# Patient Record
Sex: Male | Born: 1960 | Race: White | Hispanic: No | Marital: Single
Health system: Southern US, Community
[De-identification: ages and names within clinical notes are randomized; demographics above are authoritative.]

## PROBLEM LIST (undated history)

## (undated) DIAGNOSIS — I509 Heart failure, unspecified: Secondary | ICD-10-CM

## (undated) DIAGNOSIS — I10 Essential (primary) hypertension: Secondary | ICD-10-CM

## (undated) HISTORY — PX: HERNIA REPAIR: SHX51

---

## 2017-08-07 ENCOUNTER — Emergency Department
Admission: EM | Admit: 2017-08-07 | Discharge: 2017-08-07 | Discharge status: Against Medical Advice | Payer: Medicare Other | Attending: Emergency Medicine | Admitting: Emergency Medicine

## 2017-08-07 ENCOUNTER — Emergency Department: Payer: Medicare Other

## 2017-08-07 ENCOUNTER — Encounter: Payer: Self-pay | Admitting: Emergency Medicine

## 2017-08-07 DIAGNOSIS — I509 Heart failure, unspecified: Secondary | ICD-10-CM | POA: Insufficient documentation

## 2017-08-07 DIAGNOSIS — R945 Abnormal results of liver function studies: Secondary | ICD-10-CM

## 2017-08-07 DIAGNOSIS — R7989 Other specified abnormal findings of blood chemistry: Secondary | ICD-10-CM | POA: Diagnosis not present

## 2017-08-07 DIAGNOSIS — I110 Hypertensive heart disease with heart failure: Secondary | ICD-10-CM | POA: Diagnosis not present

## 2017-08-07 DIAGNOSIS — Z950 Presence of cardiac pacemaker: Secondary | ICD-10-CM | POA: Insufficient documentation

## 2017-08-07 DIAGNOSIS — R1010 Upper abdominal pain, unspecified: Secondary | ICD-10-CM | POA: Insufficient documentation

## 2017-08-07 DIAGNOSIS — K8020 Calculus of gallbladder without cholecystitis without obstruction: Secondary | ICD-10-CM

## 2017-08-07 DIAGNOSIS — R109 Unspecified abdominal pain: Secondary | ICD-10-CM | POA: Diagnosis not present

## 2017-08-07 DIAGNOSIS — R1013 Epigastric pain: Secondary | ICD-10-CM

## 2017-08-07 HISTORY — DX: Heart failure, unspecified: I50.9

## 2017-08-07 HISTORY — DX: Essential (primary) hypertension: I10

## 2017-08-07 LAB — CBC WITH DIFFERENTIAL/PLATELET
BASOS ABS: 0.1 10*3/uL (ref 0–0.1)
BASOS PCT: 1 %
Eosinophils Absolute: 0.3 10*3/uL (ref 0–0.7)
Eosinophils Relative: 4 %
HCT: 53.8 % — ABNORMAL HIGH (ref 40.0–52.0)
Hemoglobin: 17.8 g/dL (ref 13.0–18.0)
LYMPHS ABS: 2.1 10*3/uL (ref 1.0–3.6)
LYMPHS PCT: 22 %
MCH: 30.4 pg (ref 26.0–34.0)
MCHC: 33.1 g/dL (ref 32.0–36.0)
MCV: 92 fL (ref 80.0–100.0)
Monocytes Absolute: 1.2 10*3/uL — ABNORMAL HIGH (ref 0.2–1.0)
Monocytes Relative: 13 %
NEUTROS ABS: 5.7 10*3/uL (ref 1.4–6.5)
NEUTROS PCT: 60 %
PLATELETS: 187 10*3/uL (ref 150–440)
RBC: 5.85 MIL/uL (ref 4.40–5.90)
RDW: 14.9 % — AB (ref 11.5–14.5)
WBC: 9.5 10*3/uL (ref 3.8–10.6)

## 2017-08-07 LAB — COMPREHENSIVE METABOLIC PANEL
ALT: 20 U/L (ref 17–63)
ANION GAP: 7 (ref 5–15)
AST: 24 U/L (ref 15–41)
Albumin: 4 g/dL (ref 3.5–5.0)
Alkaline Phosphatase: 94 U/L (ref 38–126)
BUN: 19 mg/dL (ref 6–20)
CO2: 27 mmol/L (ref 22–32)
CREATININE: 1.29 mg/dL — AB (ref 0.61–1.24)
Calcium: 8.8 mg/dL — ABNORMAL LOW (ref 8.9–10.3)
Chloride: 103 mmol/L (ref 101–111)
GFR, EST NON AFRICAN AMERICAN: 60 mL/min — AB (ref >60)
Glucose, Bld: 179 mg/dL — ABNORMAL HIGH (ref 65–99)
Potassium: 3.8 mmol/L (ref 3.5–5.1)
SODIUM: 137 mmol/L (ref 135–145)
TOTAL PROTEIN: 7.3 g/dL (ref 6.5–8.1)
Total Bilirubin: 2 mg/dL — ABNORMAL HIGH (ref 0.3–1.2)

## 2017-08-07 LAB — BILIRUBIN, FRACTIONATED(TOT/DIR/INDIR)
BILIRUBIN TOTAL: 1.8 mg/dL — AB (ref 0.3–1.2)
Bilirubin, Direct: 0.4 mg/dL (ref 0.1–0.5)
Indirect Bilirubin: 1.4 mg/dL — ABNORMAL HIGH (ref 0.3–0.9)

## 2017-08-07 LAB — URINALYSIS, COMPLETE (UACMP) WITH MICROSCOPIC
BILIRUBIN URINE: NEGATIVE
Bacteria, UA: NONE SEEN
GLUCOSE, UA: NEGATIVE mg/dL
HGB URINE DIPSTICK: NEGATIVE
Ketones, ur: NEGATIVE mg/dL
LEUKOCYTES UA: NEGATIVE
NITRITE: NEGATIVE
PH: 5 (ref 5.0–8.0)
Protein, ur: 30 mg/dL — AB
SPECIFIC GRAVITY, URINE: 1.038 — AB (ref 1.005–1.030)
Squamous Epithelial / LPF: NONE SEEN

## 2017-08-07 LAB — LIPASE, BLOOD: Lipase: 39 U/L (ref 11–51)

## 2017-08-07 LAB — PROTIME-INR
INR: 1.22
PROTHROMBIN TIME: 15.3 s — AB (ref 11.4–15.2)

## 2017-08-07 LAB — BRAIN NATRIURETIC PEPTIDE: B Natriuretic Peptide: 1139 pg/mL — ABNORMAL HIGH (ref 0.0–100.0)

## 2017-08-07 LAB — TROPONIN I: Troponin I: <0.03 ng/mL (ref <0.03)

## 2017-08-07 LAB — LACTIC ACID, PLASMA: Lactic Acid, Venous: 1.6 mmol/L (ref 0.5–1.9)

## 2017-08-07 MED ORDER — CIPROFLOXACIN HCL 500 MG PO TABS: 500.0000 mg | ORAL_TABLET | Freq: Two times a day (BID) | ORAL | 0 refills | Status: AC

## 2017-08-07 MED ORDER — IOPAMIDOL (ISOVUE-300) INJECTION 61%
30.0000 mL | Freq: Once | INTRAVENOUS | Status: DC | PRN
Start: 2017-08-07 — End: 2017-08-08

## 2017-08-07 MED ORDER — METRONIDAZOLE 500 MG PO TABS
500.0000 mg | ORAL_TABLET | Freq: Once | ORAL | Status: AC
  Administered 2017-08-07: 500 mg via ORAL
  Filled 2017-08-07: qty 1

## 2017-08-07 MED ORDER — MORPHINE SULFATE (PF) 4 MG/ML IV SOLN
4.0000 mg | Freq: Once | INTRAVENOUS | Status: AC
  Administered 2017-08-07: 4 mg via INTRAVENOUS

## 2017-08-07 MED ORDER — ONDANSETRON HCL 4 MG/2ML IJ SOLN
INTRAMUSCULAR | Status: AC
  Administered 2017-08-07: 4 mg via INTRAVENOUS
  Filled 2017-08-07: qty 2

## 2017-08-07 MED ORDER — ONDANSETRON 4 MG PO TBDP: 4.0000 mg | ORAL_TABLET | Freq: Three times a day (TID) | ORAL | 0 refills | Status: AC | PRN

## 2017-08-07 MED ORDER — MORPHINE SULFATE (PF) 4 MG/ML IV SOLN
INTRAVENOUS | Status: AC
  Administered 2017-08-07: 4 mg via INTRAVENOUS
  Filled 2017-08-07: qty 1

## 2017-08-07 MED ORDER — IOPAMIDOL (ISOVUE-300) INJECTION 61%
100.0000 mL | Freq: Once | INTRAVENOUS | Status: AC | PRN
  Administered 2017-08-07: 100 mL via INTRAVENOUS

## 2017-08-07 MED ORDER — CIPROFLOXACIN HCL 500 MG PO TABS
500.0000 mg | ORAL_TABLET | Freq: Once | ORAL | Status: AC
  Administered 2017-08-07: 500 mg via ORAL
  Filled 2017-08-07: qty 1

## 2017-08-07 MED ORDER — MORPHINE SULFATE (PF) 4 MG/ML IV SOLN
4.0000 mg | Freq: Once | INTRAVENOUS | Status: AC
  Administered 2017-08-07: 4 mg via INTRAVENOUS
  Filled 2017-08-07: qty 1

## 2017-08-07 MED ORDER — OXYCODONE HCL 5 MG PO TABS: 5.0000 mg | ORAL_TABLET | Freq: Four times a day (QID) | ORAL | 0 refills | Status: AC | PRN

## 2017-08-07 MED ORDER — METRONIDAZOLE 500 MG PO TABS: 500.0000 mg | ORAL_TABLET | Freq: Three times a day (TID) | ORAL | 0 refills | Status: AC

## 2017-08-07 MED ORDER — ONDANSETRON HCL 4 MG/2ML IJ SOLN
4.0000 mg | Freq: Once | INTRAMUSCULAR | Status: AC
  Administered 2017-08-07: 4 mg via INTRAVENOUS

## 2017-08-07 NOTE — ED Provider Notes (Signed)
[facility] Emergency Department Provider Note   ____________________________________________   First MD Initiated Contact with Patient 08/07/17 1742     (approximate)  I have reviewed the triage vital signs and the nursing notes.   HISTORY  Chief Complaint Abdominal Pain and Emesis    HPI Jesse Huffman is a 56 y.o. male Who reports onset of severe burning approximately 1-1/2 hours ago. He has nausea and vomiting with it. He has no diarrhea. He has no cramps and no fever. He says he had it start after eating grocery chicken.he has vomited up all the chicken. The pain is 9 out of 10.patient has no chest pain or tightness no shortness of breath no sweating   Past Medical History:  Diagnosis Date  . CHF (congestive heart failure) (HCC)   . Hypertension     There are no active problems to display for this patient.   Past Surgical History:  Procedure Laterality Date  . HERNIA REPAIR    umbilical hernia repair  Prior to Admission medications   Not on File    Allergies Penicillins and Sulfa antibiotics  No family history on file.  Social History Social History  Substance Use Topics  . Smoking status: Not on file  . Smokeless tobacco: Not on file  . Alcohol use Yes    Review of Systems  Constitutional: No fever/chills Eyes: No visual changes. ENT: No sore throat. Cardiovascular: Denies chest pain. Respiratory: Denies shortness of breath. Gastrointestinal: see history of present illness Genitourinary: Negative for dysuria. Musculoskeletal: Negative for back pain. Skin: Negative for rash. Neurological: Negative for headaches, focal weakness   ____________________________________________   PHYSICAL EXAM:  VITAL SIGNS: ED Triage Vitals [08/07/17 1744]  Enc Vitals Group     BP (!) 117/92     Pulse Rate 68     Resp 20     Temp      Temp src      SpO2 97 %     Weight      Height      Head Circumference      Peak  Flow      Pain Score 9     Pain Loc      Pain Edu?      Excl. in GC?     Constitutional: Alert and oriented. Ill appearing and in  acute distress. Eyes: Conjunctivae are normal.  Head: Atraumatic. Nose: No congestion/rhinnorhea. Mouth/Throat: Mucous membranes are moist.  Oropharynx non-erythematous. Neck: No stridor.  Cardiovascular: Normal rate, regular rhythm. Grossly normal heart sounds.  Good peripheral circulation. Respiratory: Normal respiratory effort.  No retractions. Lungs CTAB. Gastrointestinal: Soft tender to palpation and percussion especially in the mid abdomen  distention. No abdominal bruits.decreased bowel sounds No CVA tenderness. Musculoskeletal: No lower extremity tenderness nor edema.  No joint effusions. Neurologic:  Normal speech and language. No gross focal neurologic deficits are appreciated.  Skin:  Skin is warm, dry and intact. No rash noted. Psychiatric: Mood and affect are normal. Speech and behavior are normal.  ____________________________________________   LABS (all labs ordered are listed, but only abnormal results are displayed)  Labs Reviewed  COMPREHENSIVE METABOLIC PANEL - Abnormal; Notable for the following:       Result Value   Glucose, Bld 179 (*)    Creatinine, Ser 1.29 (*)    Calcium 8.8 (*)    Total Bilirubin 2.0 (*)    GFR calc non Af Amer 60 (*)    All other  components within normal limits  URINALYSIS, COMPLETE (UACMP) WITH MICROSCOPIC - Abnormal; Notable for the following:    Color, Urine YELLOW (*)    APPearance CLEAR (*)    Specific Gravity, Urine 1.038 (*)    Protein, ur 30 (*)    All other components within normal limits  BRAIN NATRIURETIC PEPTIDE - Abnormal; Notable for the following:    B Natriuretic Peptide 1,139.0 (*)    All other components within normal limits  CBC WITH DIFFERENTIAL/PLATELET - Abnormal; Notable for the following:    HCT 53.8 (*)    RDW 14.9 (*)    Monocytes Absolute 1.2 (*)    All other components  within normal limits  LIPASE, BLOOD  TROPONIN I  LACTIC ACID, PLASMA  TROPONIN I   ____________________________________________  EKG  EKG read and interpreted by me shows normal sinus rhythm att67 left axisflipped T's laterally in the arm and chest leads no old EKGs ____________________________________________  RADIOLOGY  IMPRESSION: Cardiomegaly without congestive failure or acute pulmonary process. Pacemaker in place.   Electronically Signed   By: Rubye OaksMelanie  Ehinger M.D.   On: 08/07/2017 18:12 _IMPRESSION: Question stone in the gallbladder neck. Consider further evaluation with right upper quadrant ultrasound.  Diffuse stranding/ inflammation surrounding the bladder suggesting cystitis.  Marked fatty infiltration of liver.  Small amount of ascites in the bilateral pericolic gutter and in the pelvis.   Electronically Signed   By: Sherian ReinWei-Chen  Lin M.D.   On: 08/07/2017 20:20___________________________________________   PROCEDURES  Procedure(s) performed:   Procedures  Critical Care performed:   ____________________________________________   INITIAL IMPRESSION / ASSESSMENT AND PLAN / ED COURSE  Pertinent labs & imaging results that were available during my care of the patient were reviewed by me and considered in my medical decision making (see chart for details).  ----------------------------------------- 8:35 PM on 08/07/2017 -----------------------------------------  Patient has no suprapubic pain or tenderness no dysuria Patient's mid abdominal pain is rmuch better than it was. Patient still has some right upper  Quadrant pain I have ordered an ultrasound since the CAT  Scan thought there may be a stone in the all bladder neck I have signed out the patient to Dr. Scotty CourtStafford  ____________________________________________   FINAL CLINICAL IMPRESSION(S) / ED DIAGNOSES  Final diagnoses:  Abdominal pain, unspecified abdominal location      NEW  MEDICATIONS STARTED DURING THIS VISIT:  New Prescriptions   No medications on file     Note:  This document was prepared using Dragon voice recognition software and may include unintentional dictation errors.    Arnaldo NatalMalinda, Emmie Frakes F, MD 08/07/17 2051

## 2017-08-07 NOTE — Consult Note (Signed)
Patient ID: Jesse Huffman, male   DOB: [birth year], 56 y.o.   MRN: [id number]  HPI Jesse Carpenndrew M Sellin is a 56 y.o. male asked to see in consultation by Dr. Scotty CourtStafford. He reports acute onset of abdominal pain nausea and vomiting after he heavy meal this afternoon. Patient reports the pain is burning and sharp in nature he was 9 out of 10 and located in the epigastric area and right upper quadrant. He fell initially there was food poisoning. No previous episodes like this before. Despite medical history significant for CHF with an ejection fraction of 25% that was performed a few months ago. 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 He is status post AICD and has been managed medically. No previous heart surgery or stents. CT scan personally reviewed showing evidence of fatty liver and some small amount of ascites. Ultrasound personally reviewed as well showed a normal common bile duct. Questionable cirrhosis with ascites and also evidence of cholelithiasis. Some thickening of the gallbladder wall up to 6 mm. Laboratory data includes a total bilirubin of 1.8 with normal direct bilirubin and elevated indirect bilirubin. INR is 1.2, BNP is 1139, redness 1.29 and BUN is 19. Hemoglobin is 17.8  HPI  Past Medical History:  Diagnosis Date  . CHF (congestive heart failure) (HCC)   . Hypertension     Past Surgical History:  Procedure Laterality Date  . HERNIA REPAIR      No family history on file.  Social History Social History  Substance Use Topics  . Smoking status: Not on file  . Smokeless tobacco: Not on file  . Alcohol use Yes    Allergies  Allergen Reactions  . Penicillins Anaphylaxis  . Sulfa Antibiotics Anaphylaxis    Current Facility-Administered Medications  Medication Dose Route Frequency Provider Last Rate Last Dose  . iopamidol (ISOVUE-300) 61 % injection 30 mL  30 mL Oral Once PRN  Arnaldo NatalMalinda, Paul F, MD       No current outpatient prescriptions on file.     Review of Systems Full ROS  was asked and was negative except for the information on the HPI  Physical Exam Blood pressure (!) 142/105, pulse 69, resp. rate 17, SpO2 96 %. CONSTITUTIONAL: NAD EYES: Pupils are equal, round, and reactive to light, Sclera are non-icteric. EARS, NOSE, MOUTH AND THROAT: The oropharynx is clear. The oral mucosa is pink and moist. Hearing is intact to voice. LYMPH NODES:  Lymph nodes in the neck are normal. RESPIRATORY:  Lungs are clear. There is normal respiratory effort, with equal breath sounds bilaterally, and without pathologic use of accessory muscles. CARDIOVASCULAR: Heart is regular without murmurs, gallops, or rubs. GI: The abdomen is soft, mild TTP RUQ, no peritonitis , no Murphy.There are no palpable masses. There is no hepatosplenomegaly. There are normal bowel sounds in all quadrants. GU: Rectal deferred.   MUSCULOSKELETAL: Normal muscle strength and tone. No cyanosis . Left LE pitting edema up to mid calf SKIN: Turgor is good and there are no pathologic skin lesions or ulcers. NEUROLOGIC: Motor and sensation is grossly normal. Cranial nerves are grossly intact. PSYCH:  Oriented to person, place and time. Affect is normal.  Data Reviewed  I have personally reviewed the patient's imaging, laboratory findings and medical records.    Assessment/Plan 56 year old male with significant history of CHF with ejection fraction roughly of 25% now presented with abdominal pain nausea and vomiting. Difficult  to exclude cholecystitis. Imaging is status are so far inconclusive for evidence of cholecystitis and clinically there is no evidence of Murphy sign. He does have some images concerning for cirrhosis and ascites. Given all of these mixed findings also just admission to the hospital and performing a HIDA scan as well as obtain a cardiology evaluation with a repeat echocardiogram. No  need for emergent surgical revision at this time. No evidence of cholangitis with sepsis at this time. Discussed with the patient detail and I did share with them my thoughts. Patient is adamant that he wishes to go back to [facility] were he has he's longtime cardiologist available. I've also discussed situation with Dr. Scotty Court from the ER. I have also added a direct and indirect bilirubin as well as order for saline bolus for hydration. Extensive counseling provided. Total time for encounter was 80 minutes with greater than 50% spent in counseling and coordination of care    Sterling Big, MD FACS General Surgeon 08/07/2017, 10:51 PM

## 2017-08-07 NOTE — Discharge Instructions (Signed)
Please follow up with a primary care doctor or general surgeon in [place] as soon as possible for continued monitoring of your symptoms and possible surgery of your gallbladder. Return to the nearest ER if you have worsening of your symptoms including fever, vomiting, or severe pain.  Results for orders placed or performed during the hospital encounter of 08/07/17  Lipase, blood  Result Value Ref Range   Lipase 39 11 - 51 U/L  Comprehensive metabolic panel  Result Value Ref Range   Sodium 137 135 - 145 mmol/L   Potassium 3.8 3.5 - 5.1 mmol/L   Chloride 103 101 - 111 mmol/L   CO2 27 22 - 32 mmol/L   Glucose, Bld 179 (H) 65 - 99 mg/dL   BUN 19 6 - 20 mg/dL   Creatinine, Ser 1.611.29 (H) 0.61 - 1.24 mg/dL   Calcium 8.8 (L) 8.9 - 10.3 mg/dL   Total Protein 7.3 6.5 - 8.1 g/dL   Albumin 4.0 3.5 - 5.0 g/dL   AST 24 15 - 41 U/L   ALT 20 17 - 63 U/L   Alkaline Phosphatase 94 38 - 126 U/L   Total Bilirubin 2.0 (H) 0.3 - 1.2 mg/dL   GFR calc non Af Amer 60 (L) >60 mL/min   GFR calc Af Amer >60 >60 mL/min   Anion gap 7 5 - 15  Urinalysis, Complete w Microscopic  Result Value Ref Range   Color, Urine YELLOW (A) YELLOW   APPearance CLEAR (A) CLEAR   Specific Gravity, Urine 1.038 (H) 1.005 - 1.030   pH 5.0 5.0 - 8.0   Glucose, UA NEGATIVE NEGATIVE mg/dL   Hgb urine dipstick NEGATIVE NEGATIVE   Bilirubin Urine NEGATIVE NEGATIVE   Ketones, ur NEGATIVE NEGATIVE mg/dL   Protein, ur 30 (A) NEGATIVE mg/dL   Nitrite NEGATIVE NEGATIVE   Leukocytes, UA NEGATIVE NEGATIVE   RBC / HPF 0-5 0 - 5 RBC/hpf   WBC, UA 0-5 0 - 5 WBC/hpf   Bacteria, UA NONE SEEN NONE SEEN   Squamous Epithelial / LPF NONE SEEN NONE SEEN   Mucus PRESENT   Troponin I  Result Value Ref Range   Troponin I <0.03 <0.03 ng/mL  Lactic acid, plasma  Result Value Ref Range   Lactic Acid, Venous 1.6 0.5 - 1.9 mmol/L  Brain natriuretic peptide  Result Value Ref Range   B Natriuretic Peptide 1,139.0 (H) 0.0 - 100.0 pg/mL  CBC with  Differential  Result Value Ref Range   WBC 9.5 3.8 - 10.6 K/uL   RBC 5.85 4.40 - 5.90 MIL/uL   Hemoglobin 17.8 13.0 - 18.0 g/dL   HCT 09.653.8 (H) 04.540.0 - 40.952.0 %   MCV 92.0 80.0 - 100.0 fL   MCH 30.4 26.0 - 34.0 pg   MCHC 33.1 32.0 - 36.0 g/dL   RDW 81.114.9 (H) 91.411.5 - 78.214.5 %   Platelets 187 150 - 440 K/uL   Neutrophils Relative % 60 %   Neutro Abs 5.7 1.4 - 6.5 K/uL   Lymphocytes Relative 22 %   Lymphs Abs 2.1 1.0 - 3.6 K/uL   Monocytes Relative 13 %   Monocytes Absolute 1.2 (H) 0.2 - 1.0 K/uL   Eosinophils Relative 4 %   Eosinophils Absolute 0.3 0 - 0.7 K/uL   Basophils Relative 1 %   Basophils Absolute 0.1 0 - 0.1 K/uL  Troponin I  Result Value Ref Range   Troponin I <0.03 <0.03 ng/mL  Protime-INR  Result Value Ref Range  Prothrombin Time 15.3 (H) 11.4 - 15.2 seconds   INR 1.22   Bilirubin, fractionated(tot/dir/indir)  Result Value Ref Range   Total Bilirubin 1.8 (H) 0.3 - 1.2 mg/dL   Bilirubin, Direct 0.4 0.1 - 0.5 mg/dL   Indirect Bilirubin 1.4 (H) 0.3 - 0.9 mg/dL   Ct Abdomen Pelvis W Contrast  Result Date: 08/07/2017 CLINICAL DATA:  Epigastric pain today. EXAM: CT ABDOMEN AND PELVIS WITH CONTRAST TECHNIQUE: Multidetector CT imaging of the abdomen and pelvis was performed using the standard protocol following bolus administration of intravenous contrast. CONTRAST:  ISOVUE-300 IOPAMIDOL (ISOVUE-300) INJECTION 61% COMPARISON:  None. FINDINGS: Lower chest: The heart size is enlarged. There is a small pericardial effusion. Minimal dependent atelectasis of the posterior lung bases are noted. Hepatobiliary: There is diffuse low density of the liver without vessel displacement. No focal liver lesion is identified. There is question gallstone in the gallbladder neck. The biliary tree is normal. Pancreas: Unremarkable. No pancreatic ductal dilatation or surrounding inflammatory changes. Spleen: Normal in size without focal abnormality. Adrenals/Urinary Tract: Adrenal glands are  unremarkable. Bilateral renal scars are noted. Kidneys are without renal calculi, focal lesion, or hydronephrosis. There is stranding and inflammation surrounding the bladder. Stomach/Bowel: There is a small hiatal hernia is. The the stomach is otherwise normal. The transverse colon is decompressed limiting evaluation for bowel wall thickness. There is no small or large bowel obstruction. There is diverticulosis of the colon. The appendix is not seen but no inflammation is noted around cecum. Vascular/Lymphatic: Aortic atherosclerosis. No enlarged abdominal or pelvic lymph nodes. Reproductive: Prostate is unremarkable. Other: There is small amount of ascites in the bilateral pericolic gutter and in the pelvis. Musculoskeletal: Degenerative joint changes of the spine are noted. IMPRESSION: Question stone in the gallbladder neck. Consider further evaluation with right upper quadrant ultrasound. Diffuse stranding/ inflammation surrounding the bladder suggesting cystitis. Marked fatty infiltration of liver. Small amount of ascites in the bilateral pericolic gutter and in the pelvis. Electronically Signed   By: Sherian Rein M.D.   On: 08/07/2017 20:20   Dg Chest Portable 1 View  Result Date: 08/07/2017 CLINICAL DATA:  Epigastric abdominal pain. EXAM: PORTABLE CHEST 1 VIEW COMPARISON:  None. FINDINGS: Single lead left-sided pacemaker in place, lead projecting over the right ventricle. There is cardiomegaly. No pulmonary edema. No large pleural effusion or confluent airspace disease. No pneumothorax. IMPRESSION: Cardiomegaly without congestive failure or acute pulmonary process. Pacemaker in place. Electronically Signed   By: Rubye Oaks M.D.   On: 08/07/2017 18:12   US Abdomen Limited Ruq  Result Date: 08/07/2017 CLINICAL DATA:  Epigastric pain for 1 day EXAM: ULTRASOUND ABDOMEN LIMITED RIGHT UPPER QUADRANT COMPARISON:  CT 08/07/2017 FINDINGS: Gallbladder: Calculi measuring up to 7 mm. Intraluminal sludge.  Moderate gallbladder wall thickening, nearly 6 mm. No notable tenderness to probe pressure over the gallbladder, but the patient had been medicated. Common bile duct: Diameter: Normal, 2.9 mm. Liver: Diffuse mild nodularity of the liver. Small volume perihepatic ascites. Bidirectional flow within the portal vein. IMPRESSION: 1. Cholelithiasis with moderate gallbladder wall thickening. Cannot exclude acute cholecystitis. 2. Cirrhotic appearing liver with small volume perihepatic ascites. Bidirectional flow within the portal vein, suggesting portal hypertension. Electronically Signed   By: Ellery Plunk M.D.   On: 08/07/2017 21:48

## 2017-08-07 NOTE — ED Provider Notes (Signed)
 -----------------------------------------   11:27 PM on 08/07/2017 -----------------------------------------  On reassessment at 10:00 PM after obtaining the ultrasound result that showed cholelithiasis and all bladder wall thickening, reassess the patient, felt he still had right upper quadrant tenderness. Pain and nausea are much improved from before. Consulted surgery who felt the patient warranted hospitalization with the medical service for further workup including HIDA scan, serial exams and vital sign monitoring, and pre-op cardiology clearance with echocardiogram. Patient refuses, wants to get [place] [facility] [place] tonight, and states that he'll see a doctor there. He does not currently have a doctor there except for his cardiologist, but states that he'll follow-up right away. Return precautions discussed. He has medical decision-making capacity and will be discharged against medical advice  Final diagnoses:  Abdominal pain, unspecified abdominal location  Pain of upper abdomen  Calculus of gallbladder without cholecystitis without obstruction  Elevated LFTs      Sharman CheekStafford, Adynn Caseres, MD 08/07/17 2329

## 2017-08-07 NOTE — ED Triage Notes (Signed)
Pt reports abdominal pain and vomiting that started today after eating some food from the grocery store.

## 2017-08-07 NOTE — ED Notes (Signed)
Pt's O2 sats noted to be dropping to 84%; pt's pulse ox site changed, and patient repositioned; pt's O2 sats went up to 96%. Will continue to monitor.

## 2019-03-21 IMAGING — US US ABDOMEN LIMITED
1 series · 14 of 25 positions shown · non-contrast
Comparison: CT 08/07/2017

CLINICAL DATA: Epigastric pain for 1 day

EXAM:
ULTRASOUND ABDOMEN LIMITED RIGHT UPPER QUADRANT

[Series 1: us abdomen limited · 0.30mm/px · 54 acquisitions, 14 frames shown]
[im 1/54]
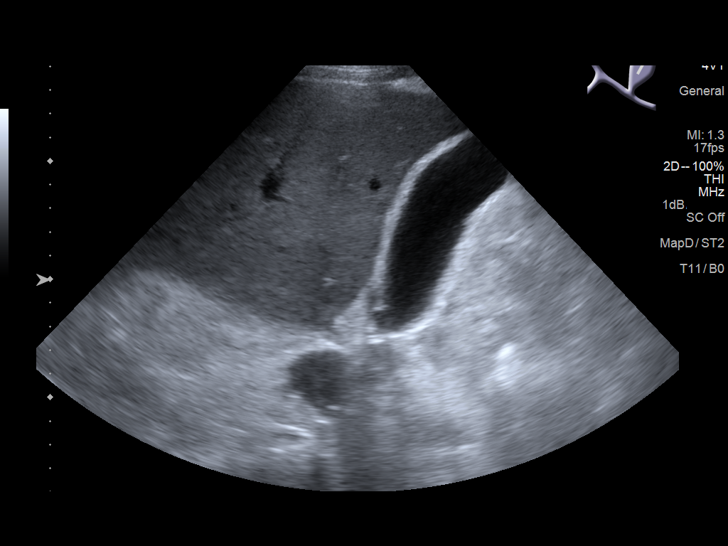
[im 5/54]
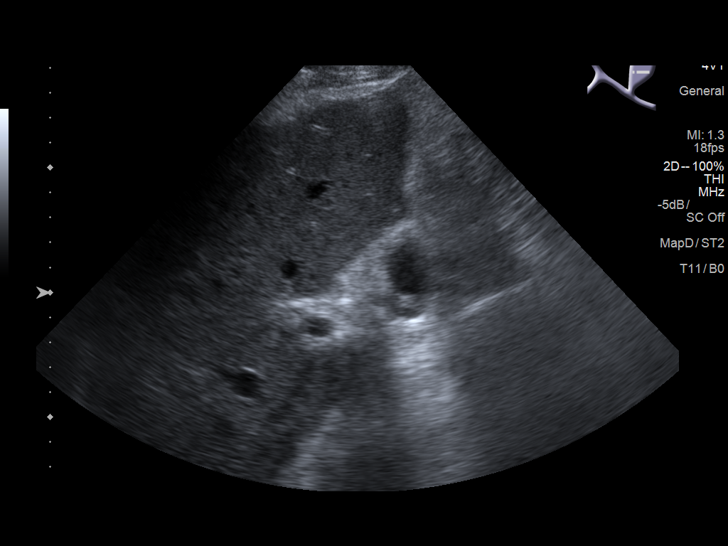
[im 9/54]
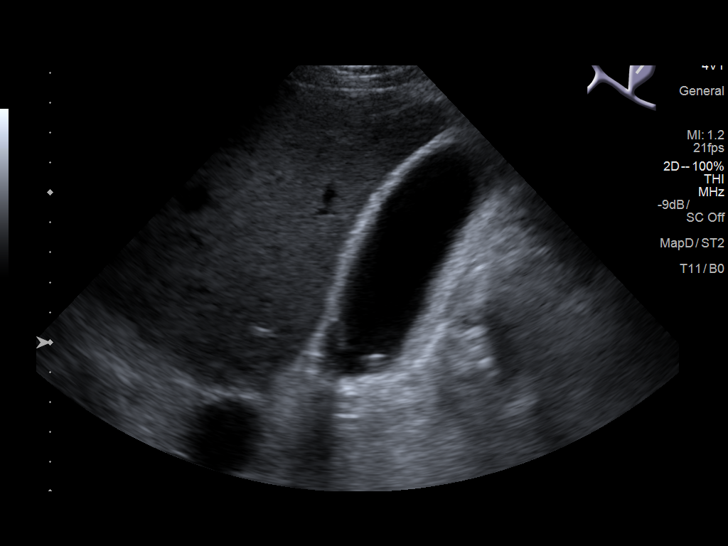
[im 14/54]
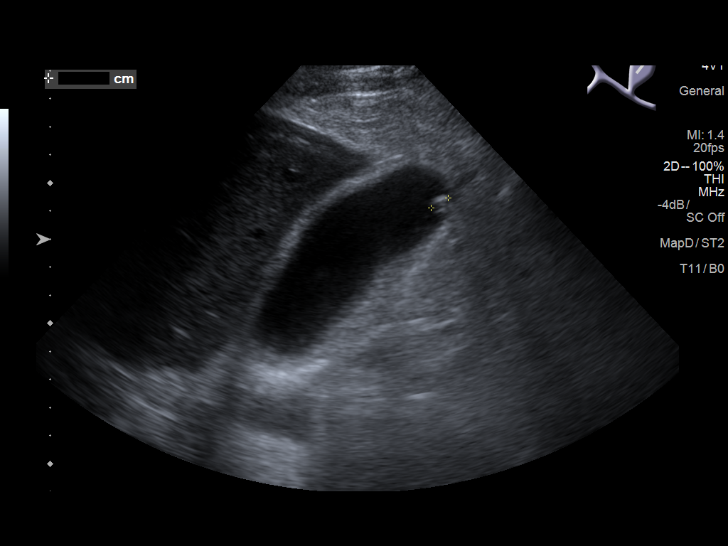
[im 18/54]
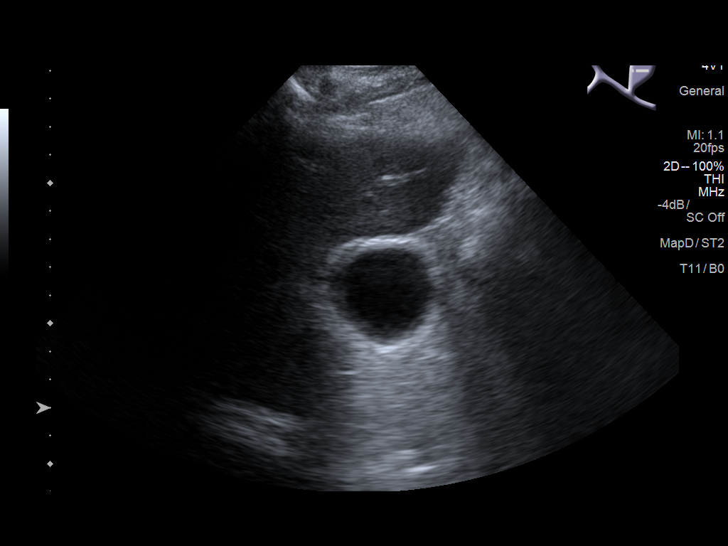
[im 20/54]
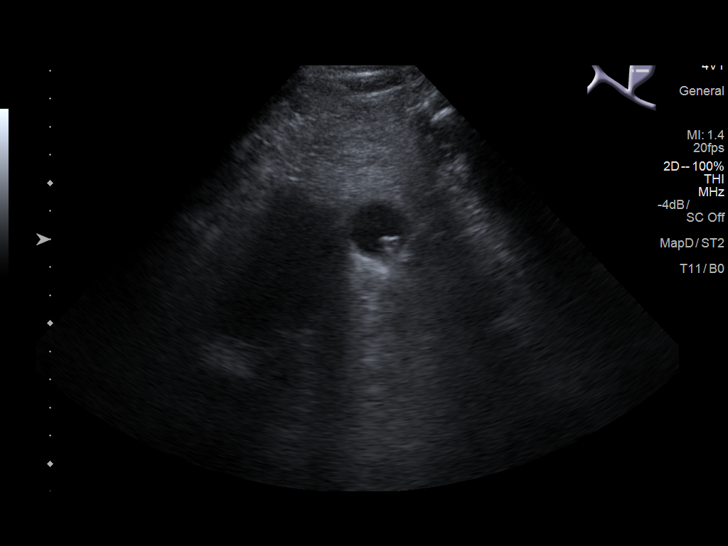
[im 25/54]
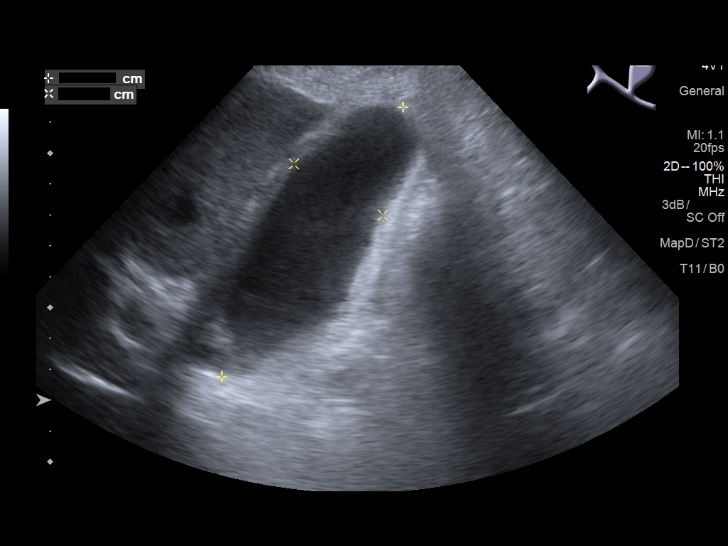
[im 29/54]
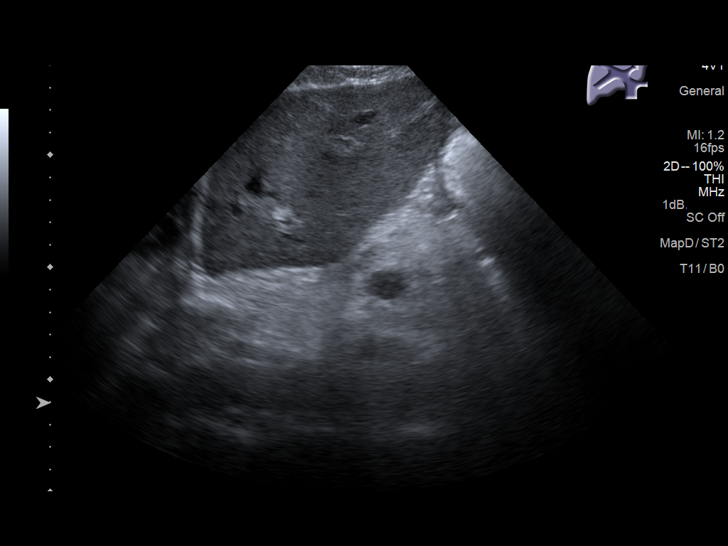
[im 34/54]
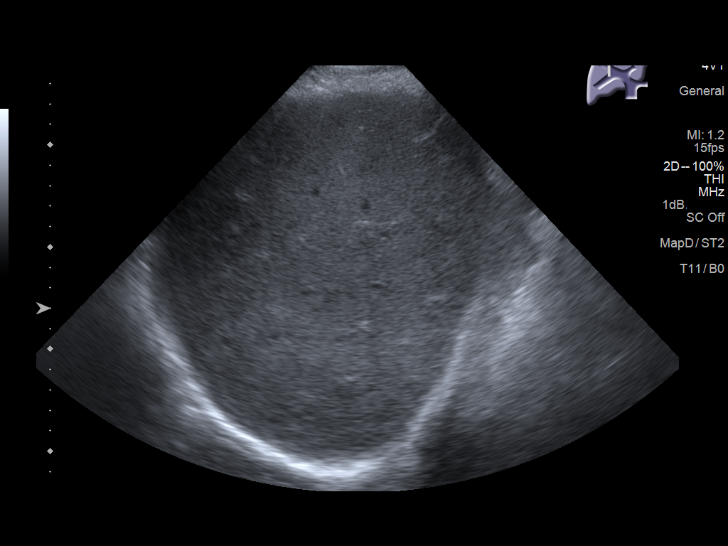
[im 36/54]
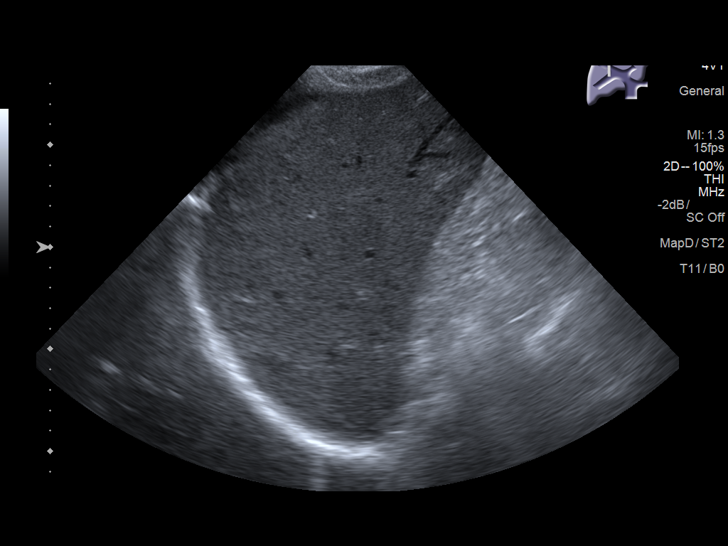
[im 40/54]
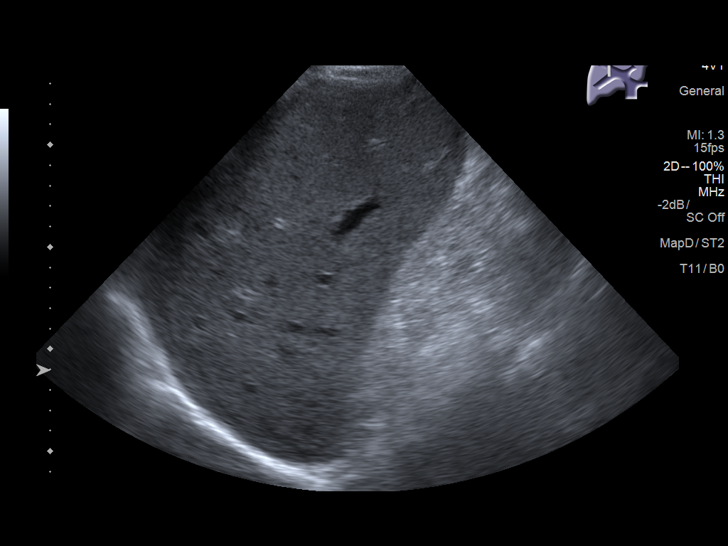
[im 45/54]
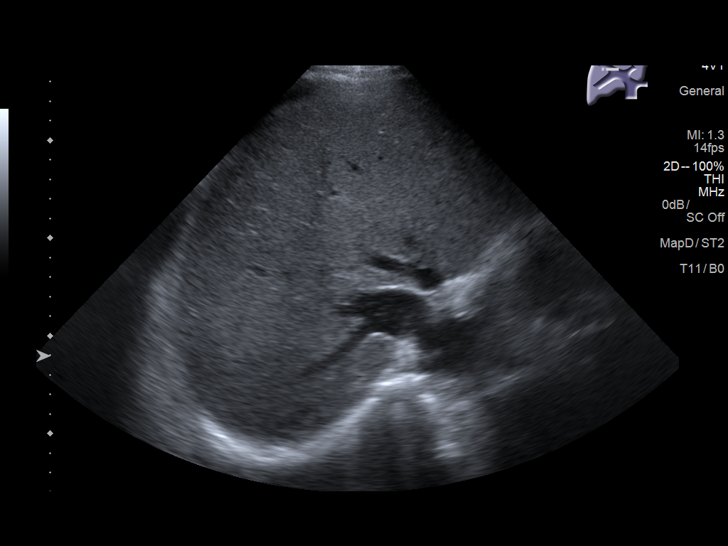
[im 49/54]
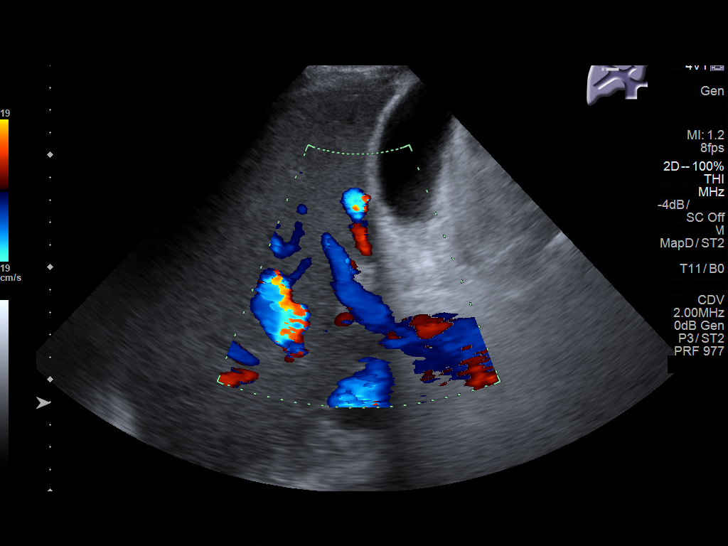
[im 54/54]
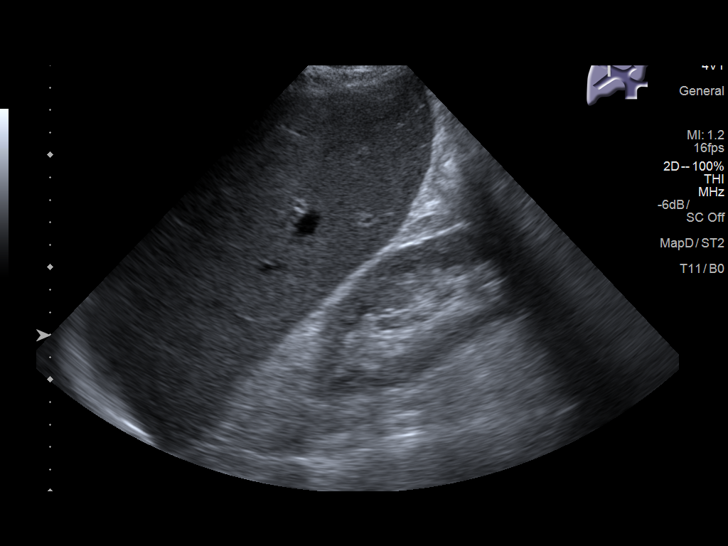

[14 of 25 positions shown; findings below may reference images not displayed]

FINDINGS: Gallbladder:

Calculi measuring up to 7 mm. Intraluminal sludge. Moderate
gallbladder wall thickening, nearly 6 mm. No notable tenderness to
probe pressure over the gallbladder, but the patient had been
medicated.

Common bile duct:

Diameter: Normal, 2.9 mm.

Liver:

Diffuse mild nodularity of the liver. Small volume perihepatic
ascites. Bidirectional flow within the portal vein.
IMPRESSION: 1. Cholelithiasis with moderate gallbladder wall thickening. Cannot
exclude acute cholecystitis.
2. Cirrhotic appearing liver with small volume perihepatic ascites.
Bidirectional flow within the portal vein, suggesting portal
hypertension.

## 2019-04-28 IMAGING — CT CT ABD-PELV W/ CM
2 of 5 series · 16 of 46 positions shown, 18 images · IV contrast (APPLIED)
Comparison: None.

CLINICAL DATA: Epigastric pain today.

EXAM:
CT ABDOMEN AND PELVIS WITH CONTRAST
TECHNIQUE: Multidetector CT imaging of the abdomen and pelvis was performed
using the standard protocol following bolus administration of
intravenous contrast.
CONTRAST:  100mL L96E2S-LBB IOPAMIDOL (L96E2S-LBB) INJECTION 61%

[Series 2: routine abd/pel with · axial · 0.86mm/px · z∈[-513,-113]mm · 13 of 92 slices shown, 15 images]
[im 6/92  soft-tissue]
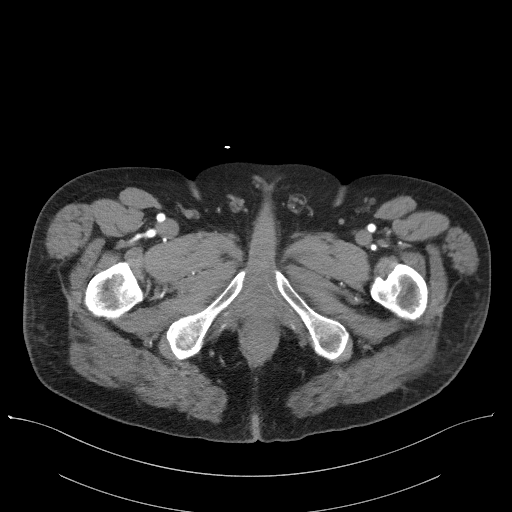
[im 6/92  bone]
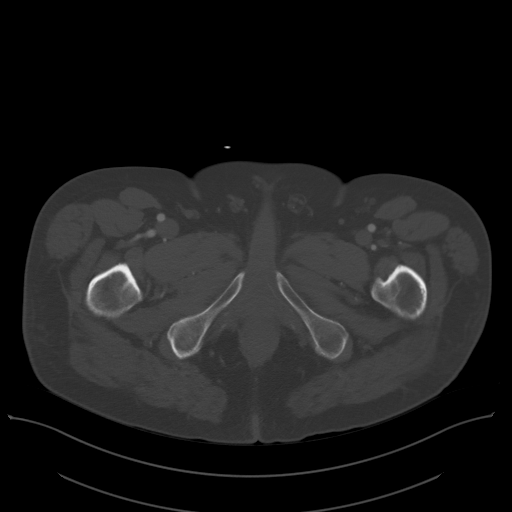
[im 11/92  soft-tissue]
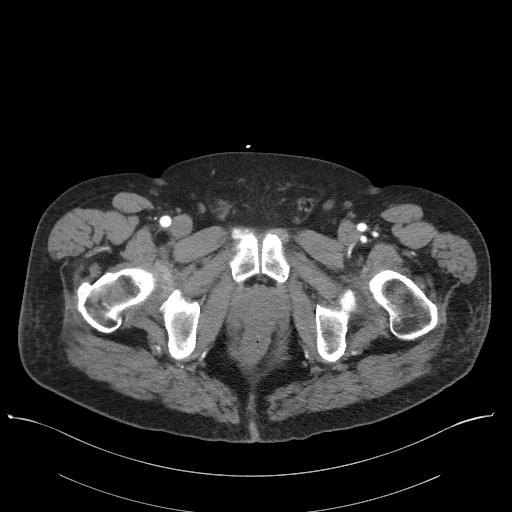
[im 21/92  soft-tissue]
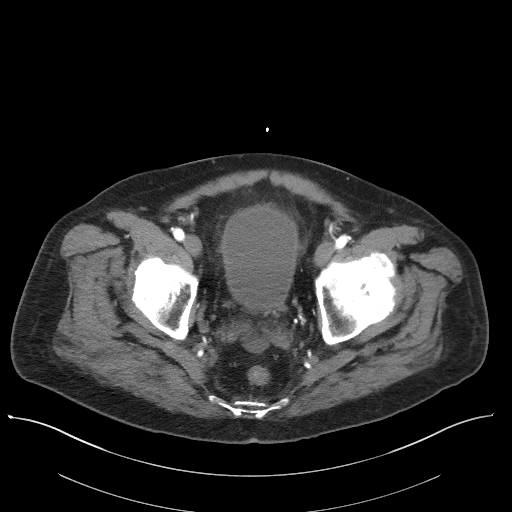
[im 26/92  soft-tissue]
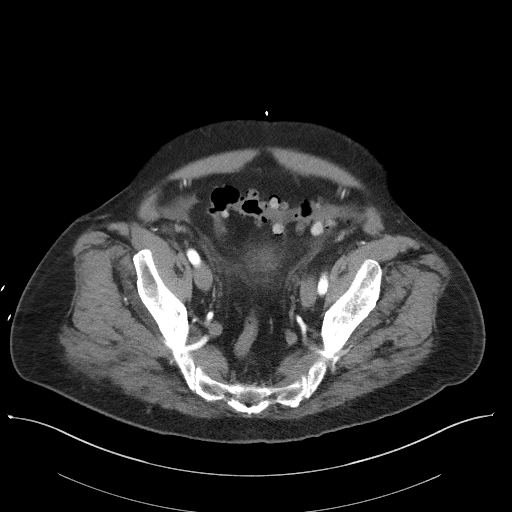
[im 31/92  soft-tissue]
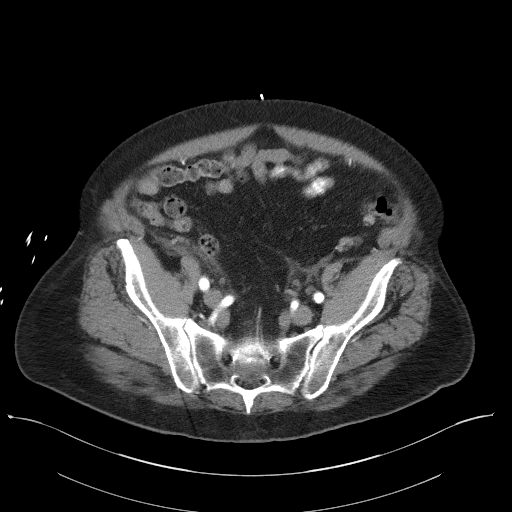
[im 41/92  soft-tissue]
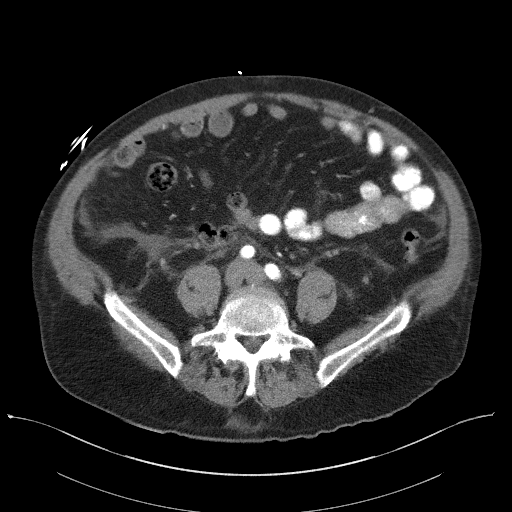
[im 46/92  soft-tissue]
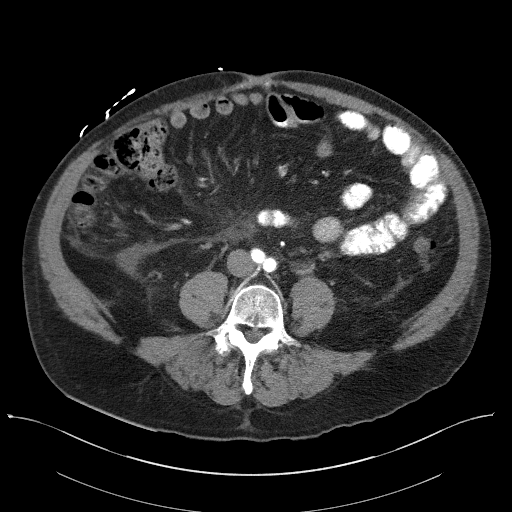
[im 51/92  soft-tissue]
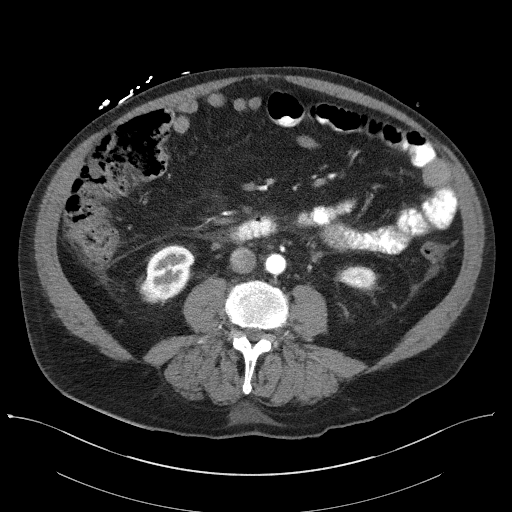
[im 61/92  soft-tissue]
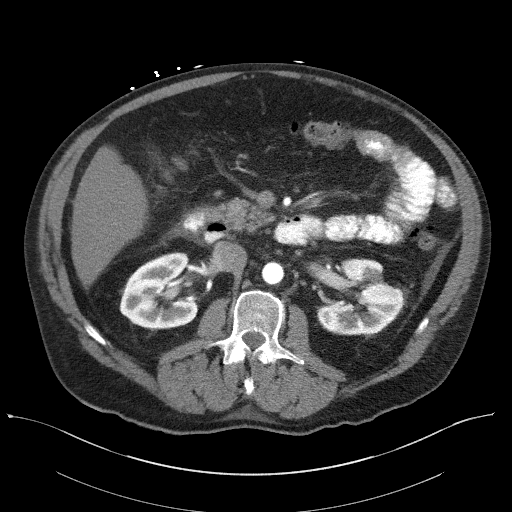
[im 61/92  bone]
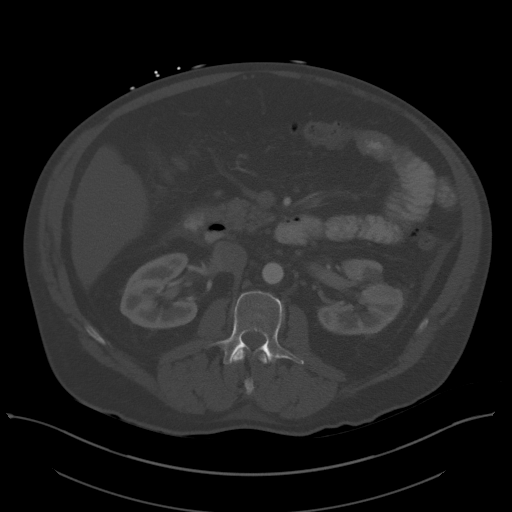
[im 66/92  soft-tissue]
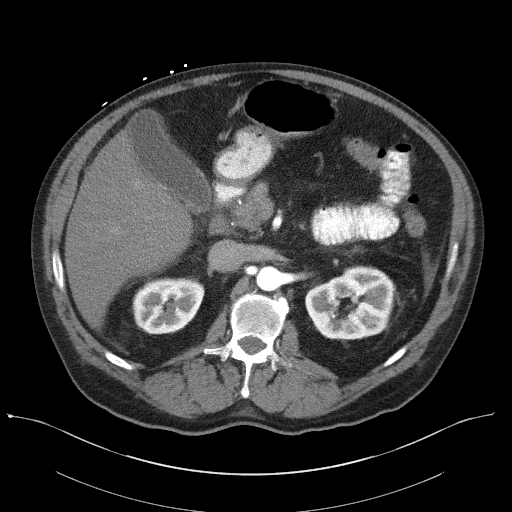
[im 71/92  soft-tissue]
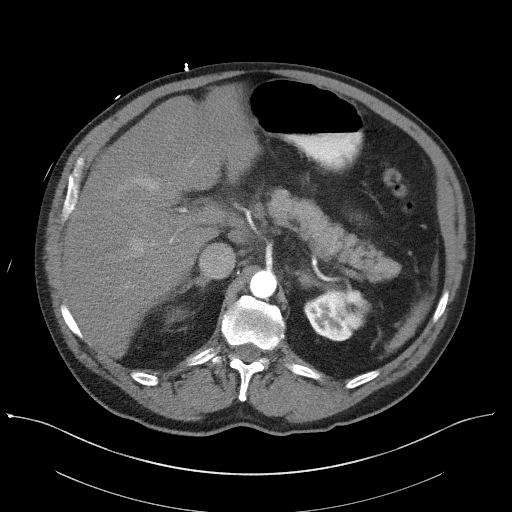
[im 81/92  soft-tissue]
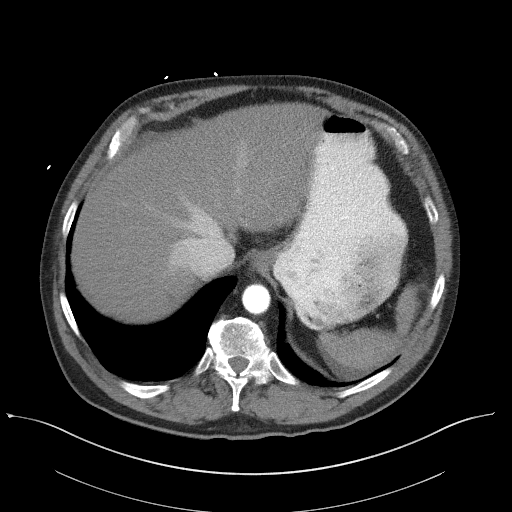
[im 86/92  soft-tissue]
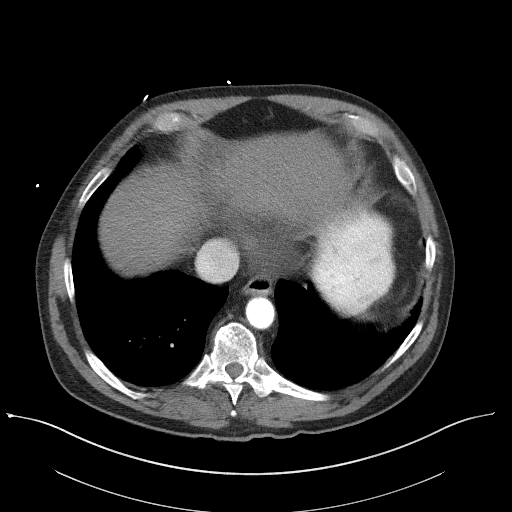

[Series 5: coronal st · coronal · 0.78mm/px · 3 of 110 slices shown]
[im 37/110  soft-tissue]
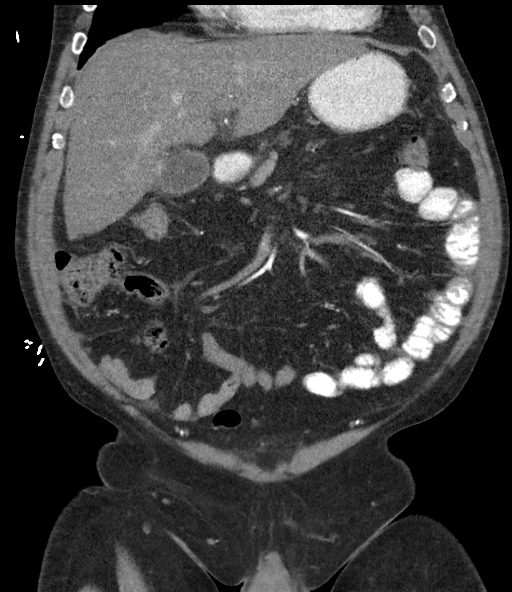
[im 49/110  soft-tissue]
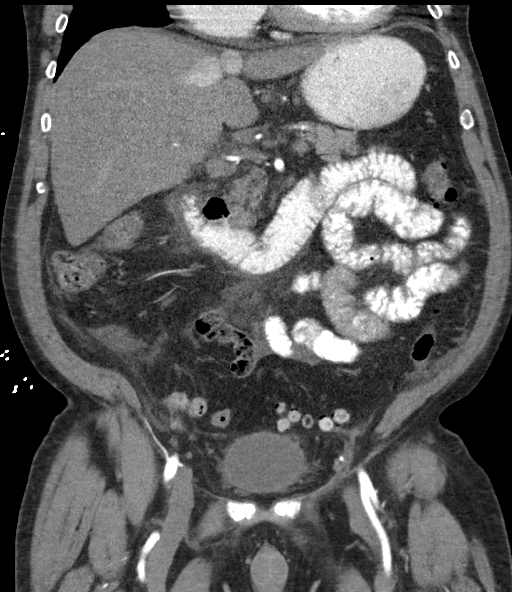
[im 61/110  soft-tissue]
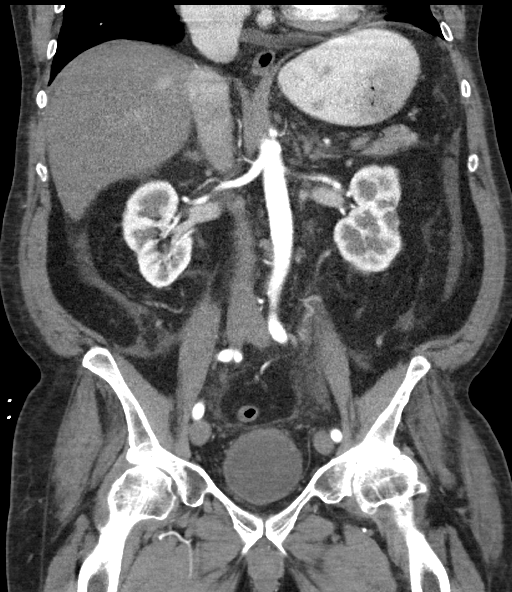

[16 of 46 positions shown; findings below may reference images not displayed]

FINDINGS: Lower chest: The heart size is enlarged. There is a small
pericardial effusion. Minimal dependent atelectasis of the posterior
lung bases are noted.

Hepatobiliary: There is diffuse low density of the liver without
vessel displacement. No focal liver lesion is identified. There is
question gallstone in the gallbladder neck. The biliary tree is
normal.

Pancreas: Unremarkable. No pancreatic ductal dilatation or
surrounding inflammatory changes.

Spleen: Normal in size without focal abnormality.

Adrenals/Urinary Tract: Adrenal glands are unremarkable. Bilateral
renal scars are noted. Kidneys are without renal calculi, focal
lesion, or hydronephrosis. There is stranding and inflammation
surrounding the bladder.

Stomach/Bowel: There is a small hiatal hernia is. The the stomach is
otherwise normal. The transverse colon is decompressed limiting
evaluation for bowel wall thickness. There is no small or large
bowel obstruction. There is diverticulosis of the colon. The
appendix is not seen but no inflammation is noted around cecum.

Vascular/Lymphatic: Aortic atherosclerosis. No enlarged abdominal or
pelvic lymph nodes.

Reproductive: Prostate is unremarkable.

Other: There is small amount of ascites in the bilateral pericolic
gutter and in the pelvis.

Musculoskeletal: Degenerative joint changes of the spine are noted.
IMPRESSION: Question stone in the gallbladder neck. Consider further evaluation
with right upper quadrant ultrasound.

Diffuse stranding/ inflammation surrounding the bladder suggesting
cystitis.

Marked fatty infiltration of liver.

Small amount of ascites in the bilateral pericolic gutter and in the
pelvis.

## 2021-04-05 DEATH — deceased
